# Patient Record
Sex: Female | Born: 2000 | Hispanic: Yes | Marital: Single
Health system: Southern US, Community
[De-identification: ages and names within clinical notes are randomized; demographics above are authoritative.]

---
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
## 2021-02-27 ENCOUNTER — Ambulatory Visit: Payer: PRIVATE HEALTH INSURANCE | Admitting: Dermatology
